# Patient Record
Sex: Male | Born: 1955 | Race: White | Hispanic: No | Marital: Married | Smoking: Never smoker
Health system: Southern US, Community
[De-identification: ages and names within clinical notes are randomized; demographics above are authoritative.]

## PROBLEM LIST (undated history)

## (undated) DIAGNOSIS — N200 Calculus of kidney: Secondary | ICD-10-CM

## (undated) DIAGNOSIS — E7800 Pure hypercholesterolemia, unspecified: Secondary | ICD-10-CM

---

## 2011-11-09 ENCOUNTER — Emergency Department (HOSPITAL_COMMUNITY): Payer: BC Managed Care – PPO

## 2011-11-09 ENCOUNTER — Emergency Department (HOSPITAL_COMMUNITY)
Admission: EM | Admit: 2011-11-09 | Discharge: 2011-11-09 | Disposition: A | Discharge status: Home / Self Care | Payer: BC Managed Care – PPO | Attending: Emergency Medicine | Admitting: Emergency Medicine

## 2011-11-09 ENCOUNTER — Encounter (HOSPITAL_COMMUNITY): Payer: Self-pay | Admitting: *Deleted

## 2011-11-09 DIAGNOSIS — R42 Dizziness and giddiness: Secondary | ICD-10-CM | POA: Insufficient documentation

## 2011-11-09 DIAGNOSIS — R55 Syncope and collapse: Secondary | ICD-10-CM | POA: Insufficient documentation

## 2011-11-09 DIAGNOSIS — E7800 Pure hypercholesterolemia, unspecified: Secondary | ICD-10-CM | POA: Insufficient documentation

## 2011-11-09 DIAGNOSIS — R110 Nausea: Secondary | ICD-10-CM | POA: Insufficient documentation

## 2011-11-09 DIAGNOSIS — R5381 Other malaise: Secondary | ICD-10-CM | POA: Insufficient documentation

## 2011-11-09 HISTORY — DX: Pure hypercholesterolemia, unspecified: E78.00

## 2011-11-09 LAB — DIFFERENTIAL
Basophils Absolute: 0 10*3/uL (ref 0.0–0.1)
Basophils Relative: 0 % (ref 0–1)
Eosinophils Absolute: 0.1 10*3/uL (ref 0.0–0.7)
Eosinophils Relative: 1 % (ref 0–5)
Lymphocytes Relative: 12 % (ref 12–46)
Lymphs Abs: 1.3 10*3/uL (ref 0.7–4.0)
Monocytes Absolute: 0.7 10*3/uL (ref 0.1–1.0)
Monocytes Relative: 7 % (ref 3–12)
Neutro Abs: 8.4 10*3/uL — ABNORMAL HIGH (ref 1.7–7.7)
Neutrophils Relative %: 80 % — ABNORMAL HIGH (ref 43–77)

## 2011-11-09 LAB — BASIC METABOLIC PANEL
BUN: 14 mg/dL (ref 6–23)
CO2: 27 mEq/L (ref 19–32)
Calcium: 9.6 mg/dL (ref 8.4–10.5)
Chloride: 101 mEq/L (ref 96–112)
Creatinine, Ser: 0.74 mg/dL (ref 0.50–1.35)
GFR calc Af Amer: >90 mL/min (ref >90)
GFR calc non Af Amer: >90 mL/min (ref >90)
Glucose, Bld: 129 mg/dL — ABNORMAL HIGH (ref 70–99)
Potassium: 4 mEq/L (ref 3.5–5.1)
Sodium: 138 mEq/L (ref 135–145)

## 2011-11-09 LAB — CBC
HCT: 38.4 % — ABNORMAL LOW (ref 39.0–52.0)
Hemoglobin: 13.5 g/dL (ref 13.0–17.0)
MCH: 32 pg (ref 26.0–34.0)
MCHC: 35.2 g/dL (ref 30.0–36.0)
MCV: 91 fL (ref 78.0–100.0)
Platelets: 164 10*3/uL (ref 150–400)
RBC: 4.22 MIL/uL (ref 4.22–5.81)
RDW: 12.6 % (ref 11.5–15.5)
WBC: 10.5 10*3/uL (ref 4.0–10.5)

## 2011-11-09 LAB — GLUCOSE, CAPILLARY: Glucose-Capillary: 127 mg/dL — ABNORMAL HIGH (ref 70–99)

## 2011-11-09 LAB — TROPONIN I: Troponin I: <0.3 ng/mL (ref <0.30)

## 2011-11-09 NOTE — ED Notes (Signed)
Family at bedside. EDP [place]

## 2011-11-09 NOTE — ED Notes (Signed)
Patient transported to X-ray 

## 2011-11-09 NOTE — ED Notes (Addendum)
Pt reports he became dizzy and diaphoretic while at work today, starting approx 1430, also felt nauseous, pt denies V/D, abd/back pain, chest pain, headache, or sob. Pt denies any recent sinus, URI or GI illness. Pt gray in color

## 2011-11-09 NOTE — ED Provider Notes (Signed)
History     CSN: [id number]  Arrival date & time 11/09/11  1515   First MD Initiated Contact with Patient 11/09/11 1547      Chief Complaint  Patient presents with  . Weakness    (Consider location/radiation/quality/duration/timing/severity/associated sxs/prior treatment) Patient is a 56 y.o. male presenting with neurologic complaint. The history is provided by the patient.  Neurologic Problem The primary symptoms include dizziness and nausea. Primary symptoms do not include headaches, fever or vomiting. The symptoms began 1 to 2 hours ago. The symptoms are resolved. The neurological symptoms are diffuse. Context: while doing minimal work.  He describes the dizziness as faintness and a sensation of spinning. The dizziness began today. The dizziness has been resolved since its onset. It is a new problem. Associated with: work. Dizziness also occurs with nausea, weakness and diaphoresis. Dizziness does not occur with blurred vision, tinnitus or vomiting.  Additional symptoms include weakness. Additional symptoms do not include neck stiffness or tinnitus. Medical issues do not include seizures, cerebral vascular accident, alcohol use, drug use, diabetes or hypertension. Associated medical issues comments: HLD, Narcolepsy.    Past Medical History  Diagnosis Date  . Hypercholesteremia     History reviewed. No pertinent past surgical history.  No family history on file.  History  Substance Use Topics  . Smoking status: Never Smoker   . Smokeless tobacco: Not on file  . Alcohol Use:       Review of Systems  Constitutional: Positive for diaphoresis. Negative for fever.  HENT: Negative for neck pain, neck stiffness and tinnitus.   Eyes: Negative for blurred vision.  Respiratory: Negative for chest tightness and shortness of breath.   Cardiovascular: Negative for chest pain.  Gastrointestinal: Positive for nausea. Negative for vomiting, abdominal pain and diarrhea.  Skin: Negative  for rash.  Neurological: Positive for dizziness and weakness. Negative for syncope, speech difficulty, numbness and headaches.  All other systems reviewed and are negative.    Allergies  Review of patient's allergies indicates no known allergies.  Home Medications   Current Outpatient Rx  Name Route Sig Dispense Refill  . MODAFINIL 200 MG PO TABS Oral Take 200 mg by mouth every morning.    Marland Kitchen SIMVASTATIN 20 MG PO TABS Oral Take 20 mg by mouth at bedtime.      BP 114/61  Pulse 61  Resp 13  SpO2 100%  Physical Exam  Constitutional: He is oriented to person, place, and time. He appears well-developed and well-nourished.  HENT:  Head: Normocephalic and atraumatic.  Eyes: EOM are normal. Pupils are equal, round, and reactive to light. Right eye exhibits no nystagmus. Left eye exhibits no nystagmus.  Neck: Normal range of motion.  Cardiovascular: Normal rate, regular rhythm and normal heart sounds.   Pulmonary/Chest: Effort normal and breath sounds normal. No respiratory distress.  Abdominal: Soft. There is no tenderness.  Musculoskeletal: Normal range of motion.  Neurological: He is alert and oriented to person, place, and time. He has normal strength. He displays no tremor. No cranial nerve deficit or sensory deficit. He exhibits normal muscle tone. Coordination normal. GCS eye subscore is 4. GCS verbal subscore is 5. GCS motor subscore is 6.       Normal finger-to-nose, heel-to-shin, rapid-alternating-movements.  No pronator drift.   Skin: Skin is warm and dry.  Psychiatric: He has a normal mood and affect.    ED Course  Procedures (including critical care time)  Date: 11/09/2011  Rate: 64  Rhythm: normal sinus  rhythm  QRS Axis: normal  Intervals: PR prolonged  ST/T Wave abnormalities: normal  Conduction Disutrbances:first-degree A-V block   Narrative Interpretation:   Old EKG Reviewed: none available   Labs Reviewed  GLUCOSE, CAPILLARY - Abnormal; Notable for the  following:    Glucose-Capillary 127 (*)    All other components within normal limits  CBC - Abnormal; Notable for the following:    HCT 38.4 (*)    All other components within normal limits  DIFFERENTIAL - Abnormal; Notable for the following:    Neutrophils Relative 80 (*)    Neutro Abs 8.4 (*)    All other components within normal limits  BASIC METABOLIC PANEL - Abnormal; Notable for the following:    Glucose, Bld 129 (*)    All other components within normal limits  TROPONIN I   Dg Chest 2 View  11/09/2011  *RADIOLOGY REPORT*  Clinical Data: Dizziness.  Diaphoresis.  Nausea.  Generalized weakness.  CHEST - 2 VIEW 11/09/2011:  Comparison: None.  Findings: Cardiomediastinal silhouette unremarkable.   Lungs clear. Bronchovascular markings normal.  Pulmonary vascularity normal.  No pneumothorax.  No pleural effusions.  Degenerative changes involving the thoracic spine and upper thoracic scoliosis convex left.  IMPRESSION: No acute cardiopulmonary disease.  Original Report Authenticated By: Arnell Sieving, M.D.   Ct Head Wo Contrast  11/09/2011  *RADIOLOGY REPORT*  Clinical Data: Dizziness.  CT HEAD WITHOUT CONTRAST  Technique:  Contiguous axial images were obtained from the base of the skull through the vertex without contrast.  Comparison: None  Findings: The ventricles are normal.  No extra-axial fluid collections are seen.  The brainstem and cerebellum are unremarkable.  No acute intracranial findings such as infarction or hemorrhage.  No mass lesions.  The bony calvarium is intact.  The visualized paranasal sinuses and mastoid air cells are clear.  IMPRESSION: No acute intracranial findings or mass lesions.  Original Report Authenticated By: P. Loralie Champagne, M.D.     1. Near syncope   2. 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 Not strenuous activity. Typical of his usual  work.  No preceding symptoms or changes in activity.  Since then symptoms have improved but states his "head feels heavy." Denies headache. Denies CP, dyspnea, palpitations now or any time.  Exam benign. No neuro deficits.  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 He decreased his dose 1 mo ago, but has never had this issue before.   Further history from the patient who reported that his symptoms began after suddenly turning his head to the left when someone talked to him. This raises concern of vasovagal presyncope secondary to carotid sensitivity. There is no bruit appreciated. Has no findings to suggest CVA.  The remainder of his workup was benign including EKG, troponin, basic labs, chest x-ray, head CT. Given the patient's minimal comorbidities and likely benign cause feel like the patient is stable for outpatient workup of his presyncope. He does have a PCP, Dr. Janyth Pupa at [facility] in [place], and is to followup next week.  Family at bedside and amenable to plan.       Donnamarie Poag, MD 11/09/11 [phone]

## 2011-11-09 NOTE — ED Notes (Signed)
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  Pt. Denies any chest pain or sob or nausea.  Pt. Went to the break room and sat down.  When paramedics arrived pt. Was very diaphoretic, weak and dizzy.

## 2011-11-09 NOTE — ED Provider Notes (Signed)
I saw and evaluated the patient, reviewed the resident's note and I agree with the findings and plan.  56 year old male pre-syncopal symptoms. Symptom onset was quickly after turning his head to the left side. No loss of consciousness. Quick return to baseline.Symptoms possibly secondary to carotid sinus stimulation. No carotid bruit heard on exam. Doubt dissection. Patient with no neck pain. Neuro exam is nonfocal. Workup reassuring. Feel that patient can be discharged at this time. Return precautions for discussed. Outpatient followup otherwise.  Raeford Razor, MD 11/09/11 [phone]

## 2011-11-16 NOTE — ED Provider Notes (Signed)
I saw and evaluated the patient, reviewed the resident's note and I agree with the findings and plan.  56yM with syncope while at work. Nonexertional. Possible carotid sinus stimulation since onset while quickly turning head to L side. Nonfocal neurological exam and doubt carotid/vertebral dissection. No bruit heard. Pt in NAD. Lungs clear and heart RRR w/o murmur. W/u today reassuring. NO new compalints prior to DC. Further evaluation as outpt.  Raeford Razor, MD 11/16/11 [phone]

## 2013-01-31 ENCOUNTER — Emergency Department (HOSPITAL_COMMUNITY): Payer: BC Managed Care – PPO

## 2013-01-31 ENCOUNTER — Emergency Department (HOSPITAL_COMMUNITY)
Admission: EM | Admit: 2013-01-31 | Discharge: 2013-01-31 | Disposition: A | Discharge status: Home / Self Care | Payer: BC Managed Care – PPO | Attending: Emergency Medicine | Admitting: Emergency Medicine

## 2013-01-31 ENCOUNTER — Encounter (HOSPITAL_COMMUNITY): Payer: Self-pay | Admitting: Emergency Medicine

## 2013-01-31 DIAGNOSIS — M545 Low back pain, unspecified: Secondary | ICD-10-CM | POA: Insufficient documentation

## 2013-01-31 DIAGNOSIS — N200 Calculus of kidney: Secondary | ICD-10-CM | POA: Diagnosis present

## 2013-01-31 DIAGNOSIS — R109 Unspecified abdominal pain: Secondary | ICD-10-CM | POA: Insufficient documentation

## 2013-01-31 DIAGNOSIS — E7800 Pure hypercholesterolemia, unspecified: Secondary | ICD-10-CM | POA: Insufficient documentation

## 2013-01-31 DIAGNOSIS — Z79899 Other long term (current) drug therapy: Secondary | ICD-10-CM | POA: Insufficient documentation

## 2013-01-31 DIAGNOSIS — R110 Nausea: Secondary | ICD-10-CM | POA: Insufficient documentation

## 2013-01-31 HISTORY — DX: Calculus of kidney: N20.0

## 2013-01-31 LAB — URINALYSIS, ROUTINE W REFLEX MICROSCOPIC
Bilirubin Urine: NEGATIVE
Glucose, UA: 100 mg/dL — AB
Ketones, ur: NEGATIVE mg/dL
Leukocytes, UA: NEGATIVE
Nitrite: NEGATIVE
Protein, ur: NEGATIVE mg/dL
Specific Gravity, Urine: 1.017 (ref 1.005–1.030)
Urobilinogen, UA: 0.2 mg/dL (ref 0.0–1.0)
pH: 6.5 (ref 5.0–8.0)

## 2013-01-31 LAB — URINE MICROSCOPIC-ADD ON

## 2013-01-31 MED ORDER — SODIUM CHLORIDE 0.9 % IV BOLUS (SEPSIS): 1000.0000 mL | Freq: Once | INTRAVENOUS | Status: DC

## 2013-01-31 MED ORDER — HYDROMORPHONE HCL PF 1 MG/ML IJ SOLN
0.5000 mg | Freq: Once | INTRAMUSCULAR | Status: DC
  Filled 2013-01-31: qty 1

## 2013-01-31 MED ORDER — HYDROCODONE-ACETAMINOPHEN 5-325 MG PO TABS: 1.0000 | ORAL_TABLET | ORAL | Status: AC | PRN

## 2013-01-31 NOTE — ED Notes (Addendum)
PT. REPORTS RIGHT LOWER BACK PAIN RADIATING TO RIGHTLEG ONSET 4 PM TODAY , DENIES INJURY OR FALL , PT. STATED HISTORY OF KIDNEY STONE. DENIES HEMATURIA.

## 2013-01-31 NOTE — ED Notes (Signed)
Unable to give urine specimen at triage at this time .  

## 2013-01-31 NOTE — ED Provider Notes (Signed)
CSN: [id number]     Arrival date & time 01/31/13  1858 History     First MD Initiated Contact with Patient 01/31/13 1955     Chief Complaint  Patient presents with  . Back Pain   (Consider location/radiation/quality/duration/timing/severity/associated sxs/prior Treatment) HPI Dennis Hamilton 57 y.o. presents with right-sided flank to lower back pain. Pain started this afternoon. It began while he was walking back to his job from lunch. Denies any together injury or trauma. Pain sharp in character. Pain is severe severity. It waxes and wanes, and he cannot get comfortable when the pain occurs. It makes him slightly nauseated. He notes similar pain in the past related to a kidney stone. Denies vomiting, diarrhea, dysuria, or gross hematuria.   Past Medical History  Diagnosis Date  . Hypercholesteremia   . Kidney stone    History reviewed. No pertinent past surgical history. No family history on file. History  Substance Use Topics  . Smoking status: Never Smoker   . Smokeless tobacco: Not on file  . Alcohol Use: Not on file    Review of Systems  Constitutional: Negative for fever, chills, diaphoresis, activity change, appetite change and fatigue.  HENT: Negative for congestion, sore throat, rhinorrhea, sneezing and trouble swallowing.   Eyes: Negative for pain and redness.  Respiratory: Negative for cough, choking, chest tightness, shortness of breath, wheezing and stridor.   Cardiovascular: Negative for chest pain and leg swelling.  Gastrointestinal: Negative for nausea, vomiting, diarrhea, constipation, blood in stool, abdominal distention and anal bleeding.  Genitourinary: Positive for flank pain.  Musculoskeletal: Positive for back pain. Negative for myalgias.  Skin: Negative for rash.  Neurological: Negative for dizziness, speech difficulty, weakness, light-headedness, numbness and headaches.  Hematological: Negative for adenopathy.  Psychiatric/Behavioral: Negative for  confusion.    Allergies  Review of patient's allergies indicates no known allergies.  Home Medications   Current Outpatient Rx  Name  Route  Sig  Dispense  Refill  . albuterol (PROVENTIL HFA;VENTOLIN HFA) 108 (90 BASE) MCG/ACT inhaler   Inhalation   Inhale 2 puffs into the lungs every 6 (six) hours as needed for wheezing.         . Multiple Vitamin (MULTIVITAMIN WITH MINERALS) TABS   Oral   Take 1 tablet by mouth daily.         . simvastatin (ZOCOR) 20 MG tablet   Oral   Take 20 mg by mouth at bedtime.         Marland Kitchen HYDROcodone-acetaminophen (NORCO/VICODIN) 5-325 MG per tablet   Oral   Take 1 tablet by mouth every 4 (four) hours as needed for pain.   20 tablet   0    BP 151/75  Pulse 78  Temp(Src) 98.6 F (37 C) (Oral)  Resp 16  SpO2 99% Physical Exam  Nursing note and vitals reviewed. Constitutional: He is oriented to person, place, and time. He appears well-developed and well-nourished. No distress.  HENT:  Head: Normocephalic and atraumatic.  Eyes: Conjunctivae and EOM are normal. Right eye exhibits no discharge. Left eye exhibits no discharge.  Neck: Normal range of motion. Neck supple. No tracheal deviation present.  Cardiovascular: Normal rate, regular rhythm and normal heart sounds.  Exam reveals no friction rub.   No murmur heard. Pulmonary/Chest: Effort normal and breath sounds normal. No stridor. No respiratory distress. He has no wheezes. He has no rales. He exhibits no tenderness.  Abdominal: Soft. He exhibits no distension. There is no tenderness. There is CVA tenderness (right).  There is no rigidity, no rebound, no guarding, no tenderness at McBurney's point and negative Murphy's sign.  Neurological: He is alert and oriented to person, place, and time.  Skin: Skin is warm.  Psychiatric: He has a normal mood and affect.    ED Course   Procedures (including critical care time)  Labs Reviewed  URINALYSIS, ROUTINE W REFLEX MICROSCOPIC - Abnormal;  Notable for the following:    Glucose, UA 100 (*)    Hgb urine dipstick LARGE (*)    All other components within normal limits  URINE MICROSCOPIC-ADD ON - Abnormal; Notable for the following:    Bacteria, UA FEW (*)    All other components within normal limits   Ct Abdomen Pelvis Wo Contrast  01/31/2013   *RADIOLOGY REPORT*  Clinical Data: Right-sided flank pain, history of renal stones  CT ABDOMEN AND PELVIS WITHOUT CONTRAST  Technique:  Multidetector CT imaging of the abdomen and pelvis was performed following the standard protocol without intravenous contrast.  Comparison: None.  Findings:  The lack of intravenous contrast limits the ability to evaluate solid abdominal organs.  There is an approximately 4 mm stone within the superior aspect of the right ureter (axial image 40, series 2, coronal image 48) which results in mild upstream ureterectasis and pelvocaliectasis.  No additional renal stones are seen.  No stones are seen along the expected course of the left ureter.  Normal noncontrast appearance of the urinary bladder to the degree of distension.  There is minimal slightly asymmetric right-sided perinephric stranding.  Normal hepatic contour.  Normal noncontrast appearance of the gallbladder given degree distension.  No definite ascites.  Normal noncontrast appearance of the bilateral adrenal glands, pancreas and spleen.  Scattered minimal colonic diverticulosis without evidence of diverticulitis.  The bowel is otherwise normal in course and caliber without wall thickening or evidence of obstruction.  Normal noncontrast appearance of the appendix.  No pneumoperitoneum, pneumatosis or portal venous gas.  Normal caliber of the abdominal aorta.  No definite retroperitoneal, mesenteric, pelvic or inguinal lymphadenopathy on this noncontrast examination.  Normal noncontrast appearance of the pelvic organs.  No free fluid in the pelvis.  Several phleboliths are noted within the caudal aspect of the  right hemipelvis.  Limited visualization of the lower thorax is degraded secondary to patient respiratory artifact.  Minimal right basilar atelectasis. No discrete focal airspace opacities.  No pleural effusion.  Normal heart size.  No definite pericardial effusion.  No acute or aggressive osseous abnormalities.  Mild multilevel lumbar spine DDD.  Mild degenerative change of the right hip. Moderate to severe degenerative change of the left hip with joint space loss, subchondral sclerosis, osteophytosis and axial migration of the left femoral head.  Note is made of a bone island within the left acetabulum.  IMPRESSION:  1.  Punctate (approximately 4 mm) stone within the superior aspect of the right ureter which results in mild upstream right-sided ureterectasis, pelvocaliectasis and mild asymmetric right-sided perinephric stranding. 2.  No additional renal stones identified.  No evidence of left- sided urinary obstruction. 3.  Scattered minimal colonic diverticulosis. 4.  Severe left and mild right-sided hip degenerative change.   Original Report Authenticated By: Tacey Ruiz, MD   1. Nephrolithiasis     MDM  Dennis Hamilton presents with right flank pain. Afebrile vital signs stable. Physical exam significant for right CVA tenderness. He is in no acute distress. He is nontoxic. CT abdomen pelvis without contrast indicated for kidney stone. We'll also check  UA for superimposed urinary tract infection. We'll provide symptomatic control and IV hydration.  UA not suggestive of infection. CT scan significant for a 4 mm stone in the right ureter with no significant hydronephrosis. Other incidental findings as described above. Patient discharged home with pain control, care for kidney stones, and gave information for appropriate urology followup.  He was given strong return precautions including signs and symptoms of infection.  Labs and imaging reviewed. I discussed this patient's care with my attending, Dr.  Oletta Lamas.  Sena Hitch, MD 02/01/13 [phone]

## 2013-01-31 NOTE — ED Provider Notes (Signed)
I saw and evaluated the patient, reviewed the resident's note and I agree with the findings and plan.  Pt with h/o kidney stone about 20 years ago, now with sudden severe right flank pain that has subsided greatly.  Some transient nausea, no vomiting.  No fevers, chills, no recent trauma.  UA shows hematuria, CT shows 4 mm proximal ureteral stone on the right.  Will give Rx for pain, nausea and refer to urologist for follow up.  Urine strainer.    Gavin Pound. Oletta Lamas, MD 01/31/13 2137

## 2015-02-22 IMAGING — CT CT ABD-PELV W/O CM
2 of 4 series · 15 of 46 positions shown, 17 images · non-contrast
Comparison: None.

CLINICAL DATA: Right-sided flank pain, history of renal stones

CT ABDOMEN AND PELVIS WITHOUT CONTRAST
TECHNIQUE: Multidetector CT imaging of the abdomen and pelvis was
performed following the standard protocol without intravenous
contrast.

[Series 2: abd/ pelvis 5.0 i30f 1 · axial · 0.66mm/px · z∈[+1009,+1384]mm · 12 of 83 slices shown, 14 images]
[im 4/83  soft-tissue]
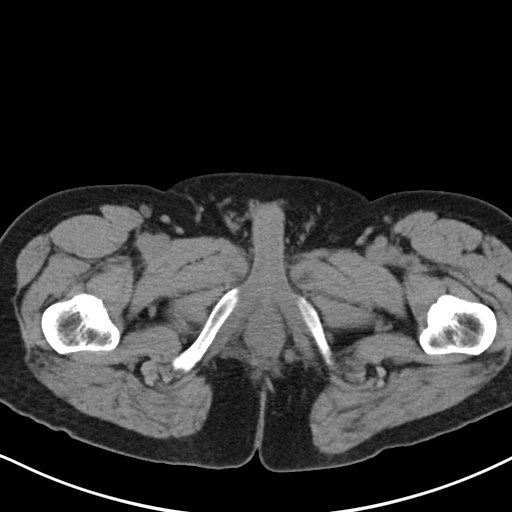
[im 4/83  bone]
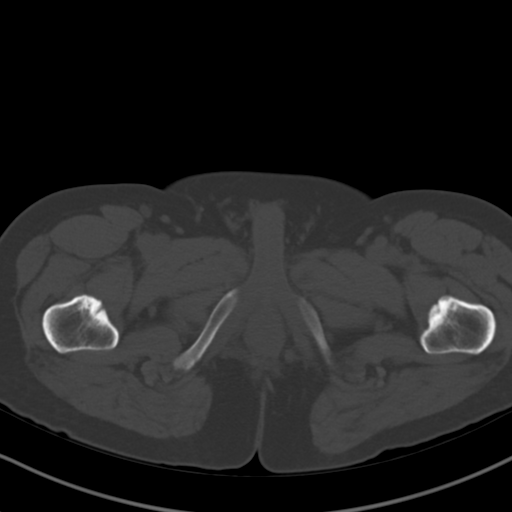
[im 11/83  soft-tissue]
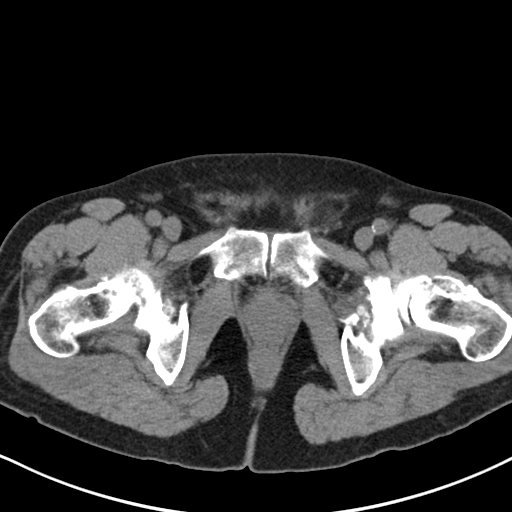
[im 18/83  soft-tissue]
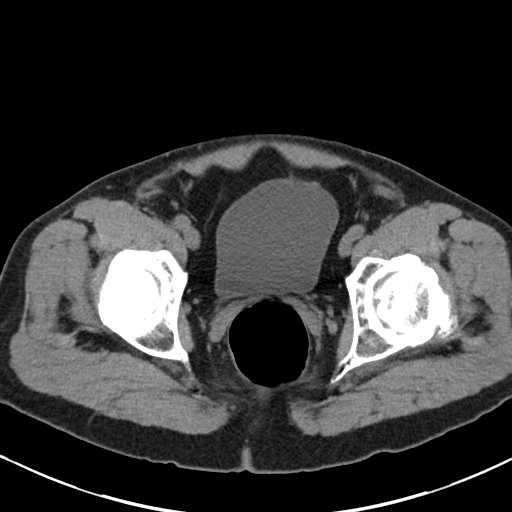
[im 25/83  soft-tissue]
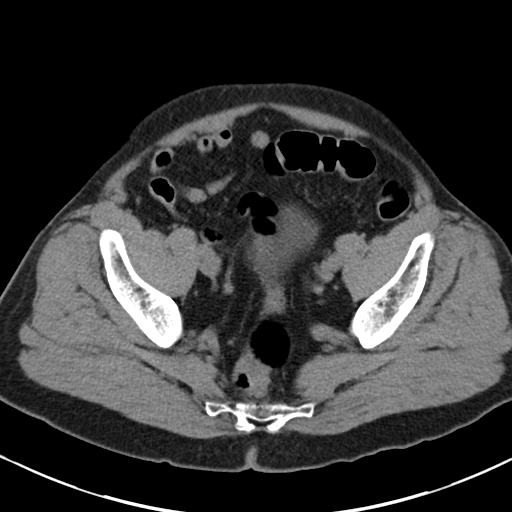
[im 33/83  soft-tissue]
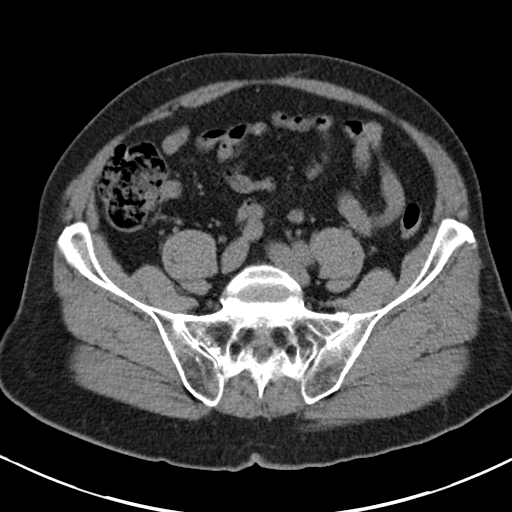
[im 40/83  soft-tissue]
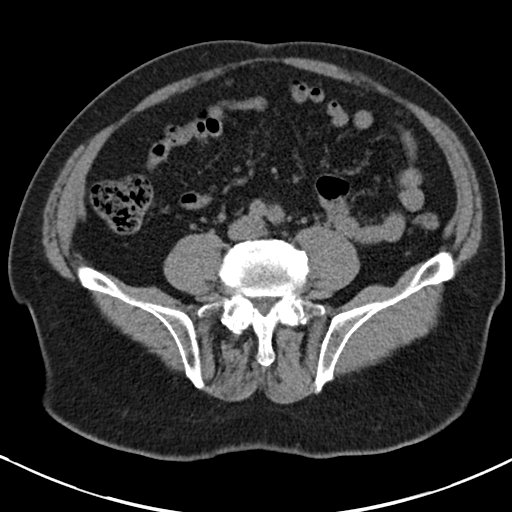
[im 43/83  soft-tissue]
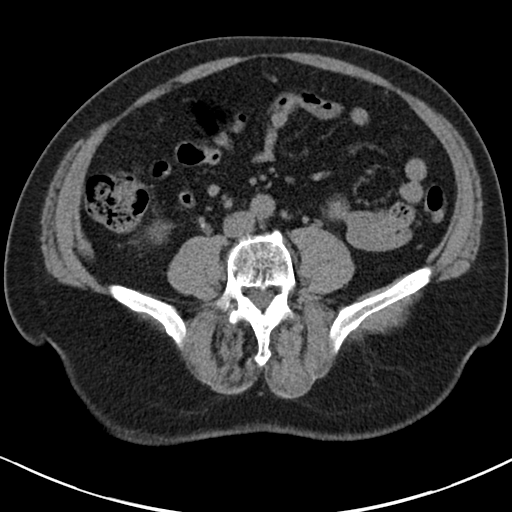
[im 50/83  soft-tissue]
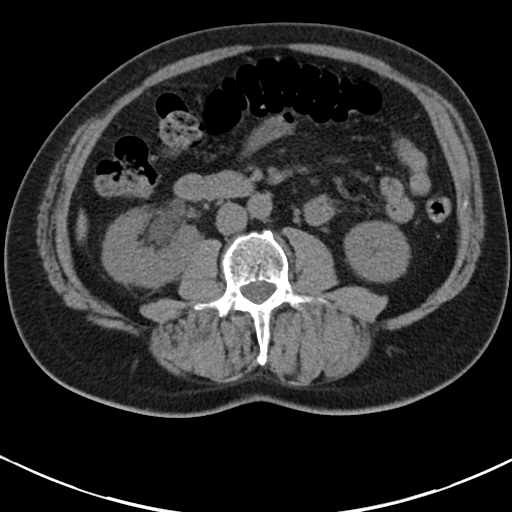
[im 58/83  soft-tissue]
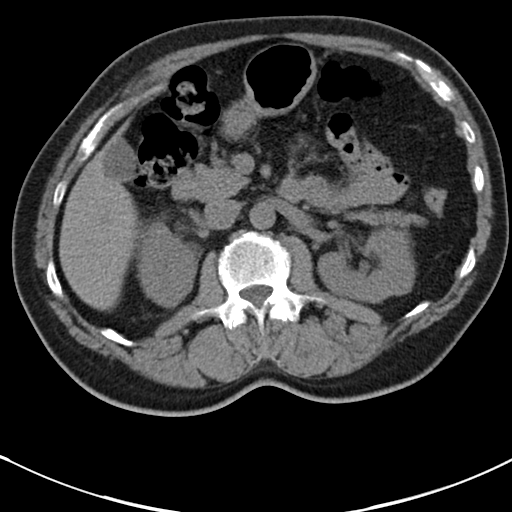
[im 58/83  bone]
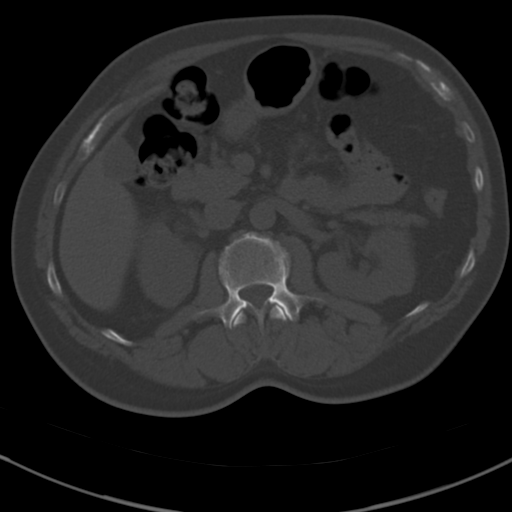
[im 65/83  soft-tissue]
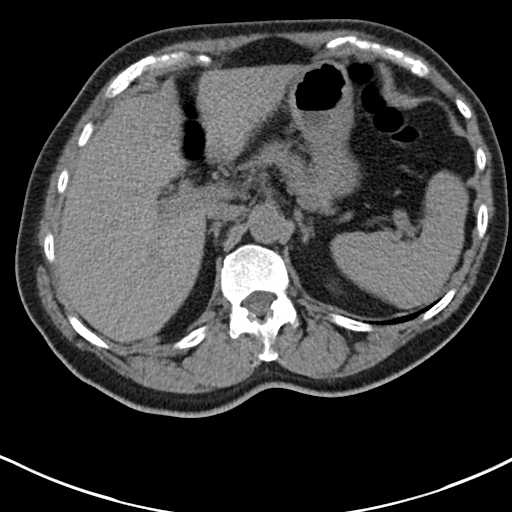
[im 72/83  soft-tissue]
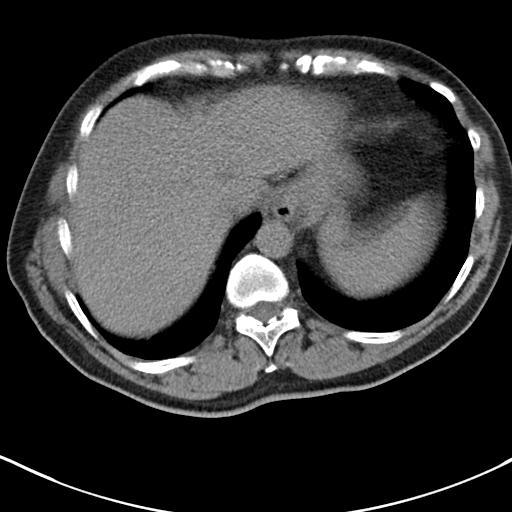
[im 79/83  soft-tissue]
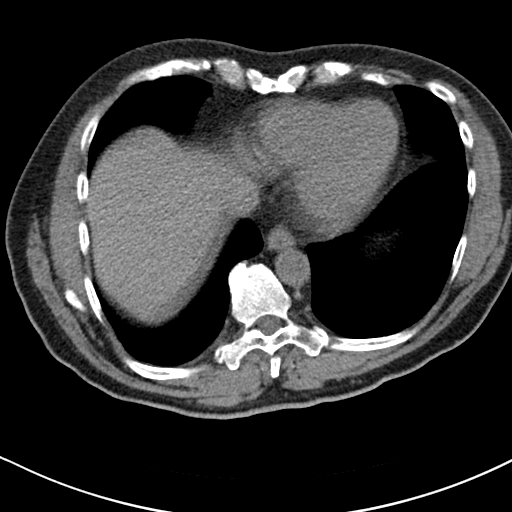

[Series 5: cor st · coronal · 0.67mm/px · 3 of 95 slices shown]
[im 32/95  soft-tissue]
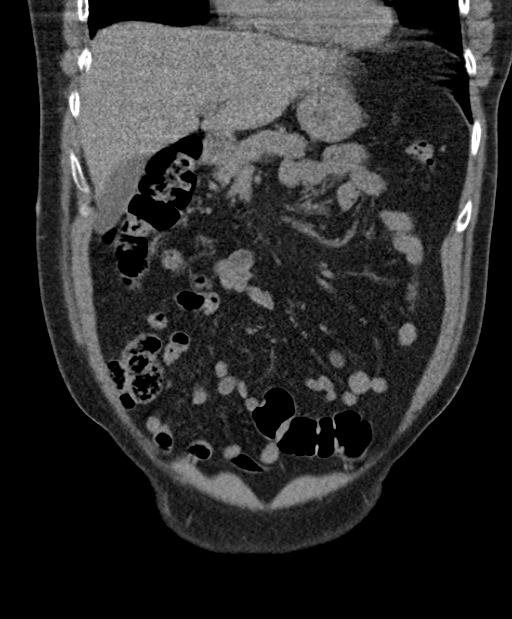
[im 42/95  soft-tissue]
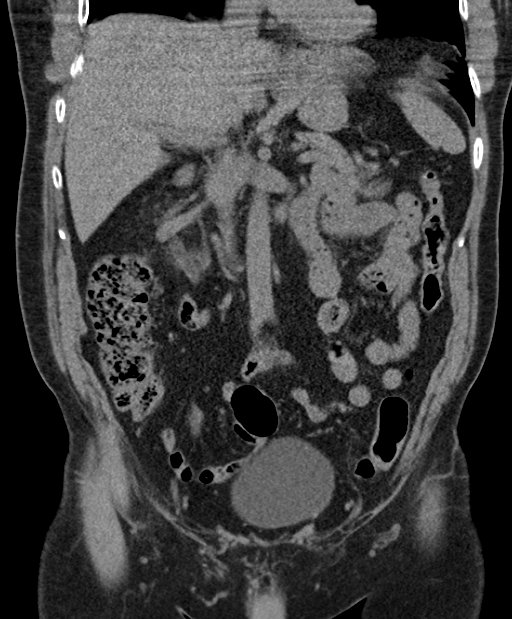
[im 53/95  soft-tissue]
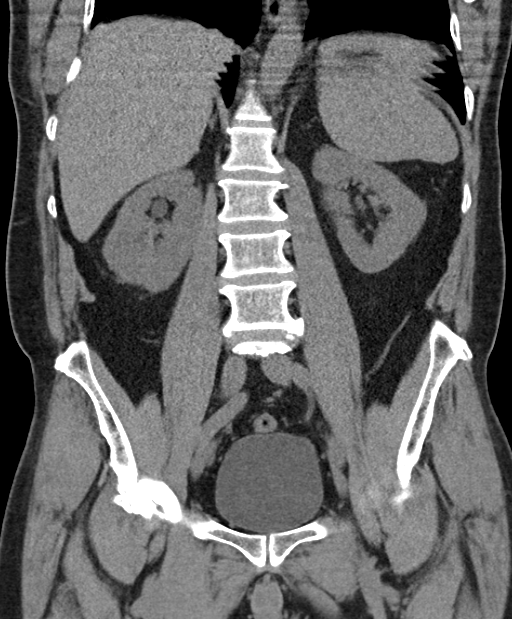

[15 of 46 positions shown; findings below may reference images not displayed]

FINDINGS: The lack of intravenous contrast limits the ability to evaluate
solid abdominal organs.

There is an approximately 4 mm stone within the superior aspect of
the right ureter (axial image 40, series 2, coronal image 48) which
results in mild upstream ureterectasis and pelvocaliectasis.  No
additional renal stones are seen.  No stones are seen along the
expected course of the left ureter.  Normal noncontrast appearance
of the urinary bladder to the degree of distension.  There is
minimal slightly asymmetric right-sided perinephric stranding.

Normal hepatic contour.  Normal noncontrast appearance of the
gallbladder given degree distension.  No definite ascites.

Normal noncontrast appearance of the bilateral adrenal glands,
pancreas and spleen.

Scattered minimal colonic diverticulosis without evidence of
diverticulitis.  The bowel is otherwise normal in course and
caliber without wall thickening or evidence of obstruction.  Normal
noncontrast appearance of the appendix.  No pneumoperitoneum,
pneumatosis or portal venous gas.

Normal caliber of the abdominal aorta.  No definite
retroperitoneal, mesenteric, pelvic or inguinal lymphadenopathy on
this noncontrast examination.

Normal noncontrast appearance of the pelvic organs.  No free fluid
in the pelvis.  Several phleboliths are noted within the caudal
aspect of the right hemipelvis.

Limited visualization of the lower thorax is degraded secondary to
patient respiratory artifact.  Minimal right basilar atelectasis.
No discrete focal airspace opacities.  No pleural effusion.

Normal heart size.  No definite pericardial effusion.

No acute or aggressive osseous abnormalities.  Mild multilevel
lumbar spine DDD.  Mild degenerative change of the right hip.
Moderate to severe degenerative change of the left hip with joint
space loss, subchondral sclerosis, osteophytosis and axial
migration of the left femoral head.  Note is made of a bone island
within the left acetabulum.
IMPRESSION: 1.  Punctate (approximately 4 mm) stone within the superior aspect
of the right ureter which results in mild upstream right-sided
ureterectasis, pelvocaliectasis and mild asymmetric right-sided
perinephric stranding.
2.  No additional renal stones identified.  No evidence of left-
sided urinary obstruction.
3.  Scattered minimal colonic diverticulosis.
4.  Severe left and mild right-sided hip degenerative change.

## 2017-12-08 ENCOUNTER — Emergency Department (HOSPITAL_COMMUNITY)
Admission: EM | Admit: 2017-12-08 | Discharge: 2017-12-08 | Disposition: A | Discharge status: Home / Self Care | Payer: BLUE CROSS/BLUE SHIELD | Attending: Emergency Medicine | Admitting: Emergency Medicine

## 2017-12-08 ENCOUNTER — Encounter (HOSPITAL_COMMUNITY): Payer: Self-pay

## 2017-12-08 DIAGNOSIS — Z87442 Personal history of urinary calculi: Secondary | ICD-10-CM | POA: Insufficient documentation

## 2017-12-08 DIAGNOSIS — R109 Unspecified abdominal pain: Secondary | ICD-10-CM | POA: Diagnosis present

## 2017-12-08 DIAGNOSIS — N23 Unspecified renal colic: Secondary | ICD-10-CM | POA: Diagnosis not present

## 2017-12-08 LAB — URINALYSIS, ROUTINE W REFLEX MICROSCOPIC
Bacteria, UA: NONE SEEN
Bilirubin Urine: NEGATIVE
Glucose, UA: NEGATIVE mg/dL
Ketones, ur: NEGATIVE mg/dL
Leukocytes, UA: NEGATIVE
Nitrite: NEGATIVE
Protein, ur: 30 mg/dL — AB
RBC / HPF: >50 RBC/hpf — ABNORMAL HIGH (ref 0–5)
Specific Gravity, Urine: 1.017 (ref 1.005–1.030)
pH: 8 (ref 5.0–8.0)

## 2017-12-08 MED ORDER — OXYCODONE-ACETAMINOPHEN 5-325 MG PO TABS: 1.0000 | ORAL_TABLET | ORAL | 0 refills | Status: AC | PRN

## 2017-12-08 MED ORDER — ONDANSETRON HCL 4 MG PO TABS: 4.0000 mg | ORAL_TABLET | Freq: Four times a day (QID) | ORAL | 0 refills | Status: AC

## 2017-12-08 NOTE — Discharge Instructions (Signed)
You can also take 600 mg of ibuprofen every 6 hours as needed for pain. It it safe to take with percocet and you'll get better pain relief taking them both together.

## 2017-12-08 NOTE — ED Provider Notes (Signed)
[facility]-EMERGENCY DEPT Provider Note   CSN: [id number] Arrival date & time: 12/08/17  1814     History   Chief Complaint Chief Complaint  Patient presents with  . Flank Pain    HPI Dennis Hamilton is a 62 y.o. male.  HPI  62 year old male with left flank pain.  Onset around 530 this evening while at rest.  He describes severe, sharp pain in his left mid back to the left flank.  He has a past history of kidney stones and says that the pain felt very similar.  Associated with nausea.  No vomiting.  The pain has actually since completely resolved.  He still fevers or chills.  No acute urinary complaints.  Past Medical History:  Diagnosis Date  . Hypercholesteremia   . Kidney stone     Patient Active Problem List   Diagnosis Date Noted  . Nephrolithiasis 01/31/2013    History reviewed. No pertinent surgical history.      Home Medications    Prior to Admission medications   Medication Sig Start Date End Date Taking? Authorizing Provider  albuterol (PROVENTIL HFA;VENTOLIN HFA) 108 (90 BASE) MCG/ACT inhaler Inhale 2 puffs into the lungs every 6 (six) hours as needed for wheezing.   Yes [provider]  escitalopram (LEXAPRO) 10 MG tablet Take 10 mg by mouth daily. 07/21/17  Yes [provider]  Multiple Vitamin (MULTIVITAMIN WITH MINERALS) TABS Take 1 tablet by mouth daily.   Yes [provider]  rosuvastatin (CRESTOR) 10 MG tablet Take 10 mg by mouth daily. 11/07/17  Yes [provider]  HYDROcodone-acetaminophen (NORCO/VICODIN) 5-325 MG per tablet Take 1 tablet by mouth every 4 (four) hours as needed for pain. Patient not taking: Reported on 12/08/2017 01/31/13   Sena HitchLozier, Meryem Haertel, MD  ondansetron (ZOFRAN) 4 MG tablet Take 1 tablet (4 mg total) by mouth every 6 (six) hours. 12/08/17   Raeford RazorKohut, Hagar Sadiq, MD  oxyCODONE-acetaminophen (PERCOCET/ROXICET) 5-325 MG tablet Take 1 tablet by mouth every 4 (four) hours as needed for  severe pain. 12/08/17   Raeford RazorKohut, Tiandra Swoveland, MD    Family History History reviewed. No pertinent family history.  Social History Social History   Tobacco Use  . Smoking status: Never Smoker  Substance Use Topics  . Alcohol use: Not on file  . Drug use: No     Allergies   Patient has no known allergies.   Review of Systems Review of Systems  All systems reviewed and negative, other than as noted in HPI.  Physical Exam Updated Vital Signs BP (!) 164/91   Pulse 87   Temp 97.7 F (36.5 C) (Oral)   Resp (!) 22   Ht 5\' 6"  (1.676 m)   Wt 71.7 kg (158 lb)   SpO2 100%   BMI 25.50 kg/m   Physical Exam  Constitutional: He appears well-developed and well-nourished. No distress.  HENT:  Head: Normocephalic and atraumatic.  Eyes: Conjunctivae are normal. Right eye exhibits no discharge. Left eye exhibits no discharge.  Neck: Neck supple.  Cardiovascular: Normal rate, regular rhythm and normal heart sounds. Exam reveals no gallop and no friction rub.  No murmur heard. Pulmonary/Chest: Effort normal and breath sounds normal. No respiratory distress.  Abdominal: Soft. He exhibits no distension. There is no tenderness.  Genitourinary:  Genitourinary Comments: Left CVA tenderness  Musculoskeletal: He exhibits no edema or tenderness.  Neurological: He is alert.  Skin: Skin is warm and dry.  Psychiatric: He has a normal mood and affect.  His behavior is normal. Thought content normal.  Nursing note and vitals reviewed.    ED Treatments / Results  Labs (all labs ordered are listed, but only abnormal results are displayed) Labs Reviewed  URINALYSIS, ROUTINE W REFLEX MICROSCOPIC - Abnormal; Notable for the following components:      Result Value   APPearance CLOUDY (*)    Hgb urine dipstick LARGE (*)    Protein, ur 30 (*)    RBC / HPF >50 (*)    All other components within normal limits    EKG None  Radiology No results found.  Procedures Procedures (including critical  care time)  Medications Ordered in ED Medications - No data to display   Initial Impression / Assessment and Plan / ED Course  I have reviewed the triage vital signs and the nursing notes.  Pertinent labs & imaging results that were available during my care of the patient were reviewed by me and considered in my medical decision making (see chart for details).     62 year old male with acute onset of severe left back/flank pain reminiscent of prior ureteral colic.  He is got blood on his UA which would support this diagnosis as well.  He is actually now completely symptom-free.  I do not feel that he needs further emergent work-up.  He will be prescribed with PRN medications and expectant management.  Return precautions were discussed.  Urology follow-up as needed otherwise.  Final Clinical Impressions(s) / ED Diagnoses   Final diagnoses:  Flank pain  Ureteral colic    ED Discharge Orders        Ordered    oxyCODONE-acetaminophen (PERCOCET/ROXICET) 5-325 MG tablet  Every 4 hours PRN     12/08/17 2056    ondansetron (ZOFRAN) 4 MG tablet  Every 6 hours     12/08/17 2056       Raeford Razor, MD 12/08/17 2100

## 2017-12-08 NOTE — ED Triage Notes (Signed)
Pt presents with c/o left flank pain, hx of kidney stones. Pt denies any blood in his urine. Pt in significant pain 10/10.

## 2022-08-08 DEATH — deceased
